# Patient Record
Sex: Female | Born: 1960 | Race: White | Hispanic: No | Marital: Married | Smoking: Former smoker
Health system: Southern US, Community
[De-identification: ages and names within clinical notes are randomized; demographics above are authoritative.]

## PROBLEM LIST (undated history)

## (undated) DIAGNOSIS — I10 Essential (primary) hypertension: Secondary | ICD-10-CM

## (undated) HISTORY — PX: TUBAL LIGATION: SHX77

---

## 1983-05-29 HISTORY — PX: OTHER SURGICAL HISTORY: SHX169

## 1999-04-27 ENCOUNTER — Other Ambulatory Visit: Admission: RE | Admit: 1999-04-27 | Discharge: 1999-04-27 | Payer: Self-pay | Admitting: Obstetrics and Gynecology

## 2000-06-21 ENCOUNTER — Other Ambulatory Visit: Admission: RE | Admit: 2000-06-21 | Discharge: 2000-06-21 | Payer: Self-pay | Admitting: Obstetrics and Gynecology

## 2001-08-13 ENCOUNTER — Other Ambulatory Visit: Admission: RE | Admit: 2001-08-13 | Discharge: 2001-08-13 | Payer: Self-pay | Admitting: Obstetrics and Gynecology

## 2002-10-02 ENCOUNTER — Other Ambulatory Visit: Admission: RE | Admit: 2002-10-02 | Discharge: 2002-10-02 | Payer: Self-pay | Admitting: Obstetrics and Gynecology

## 2004-01-10 ENCOUNTER — Emergency Department (HOSPITAL_COMMUNITY): Admission: EM | Admit: 2004-01-10 | Discharge: 2004-01-10 | Payer: Self-pay | Admitting: Family Medicine

## 2004-02-28 ENCOUNTER — Ambulatory Visit: Payer: Self-pay | Admitting: Internal Medicine

## 2004-03-13 ENCOUNTER — Ambulatory Visit: Payer: Self-pay | Admitting: Internal Medicine

## 2004-03-27 ENCOUNTER — Ambulatory Visit: Payer: Self-pay | Admitting: Internal Medicine

## 2004-04-06 ENCOUNTER — Ambulatory Visit: Payer: Self-pay | Admitting: Internal Medicine

## 2005-08-31 ENCOUNTER — Ambulatory Visit (HOSPITAL_COMMUNITY): Admission: RE | Admit: 2005-08-31 | Discharge: 2005-08-31 | Payer: Self-pay | Admitting: Obstetrics and Gynecology

## 2008-03-30 ENCOUNTER — Ambulatory Visit (HOSPITAL_COMMUNITY): Admission: RE | Admit: 2008-03-30 | Discharge: 2008-03-30 | Payer: Self-pay | Admitting: Obstetrics and Gynecology

## 2011-04-20 ENCOUNTER — Other Ambulatory Visit (HOSPITAL_COMMUNITY): Payer: Self-pay | Admitting: Obstetrics and Gynecology

## 2011-04-20 DIAGNOSIS — Z1231 Encounter for screening mammogram for malignant neoplasm of breast: Secondary | ICD-10-CM

## 2011-05-24 ENCOUNTER — Ambulatory Visit (HOSPITAL_COMMUNITY)
Admission: RE | Admit: 2011-05-24 | Discharge: 2011-05-24 | Disposition: A | Discharge status: Home / Self Care | Payer: Self-pay | Source: Ambulatory Visit | Attending: Obstetrics and Gynecology | Admitting: Obstetrics and Gynecology

## 2011-05-24 DIAGNOSIS — Z1231 Encounter for screening mammogram for malignant neoplasm of breast: Secondary | ICD-10-CM

## 2015-05-19 ENCOUNTER — Encounter (HOSPITAL_COMMUNITY): Payer: Self-pay | Admitting: Emergency Medicine

## 2015-05-19 ENCOUNTER — Emergency Department (HOSPITAL_COMMUNITY)
Admission: EM | Admit: 2015-05-19 | Discharge: 2015-05-19 | Disposition: A | Discharge status: Home / Self Care | Payer: Self-pay | Source: Home / Self Care | Attending: Family Medicine | Admitting: Family Medicine

## 2015-05-19 DIAGNOSIS — J069 Acute upper respiratory infection, unspecified: Secondary | ICD-10-CM

## 2015-05-19 HISTORY — DX: Essential (primary) hypertension: I10

## 2015-05-19 MED ORDER — TRIAMCINOLONE ACETONIDE 55 MCG/ACT NA AERO: 2.0000 | INHALATION_SPRAY | Freq: Every day | NASAL | Status: AC

## 2015-05-19 NOTE — Discharge Instructions (Signed)
It was a pleasure to see you today.   I believe your symptoms are the result of a viral upper respiratory infection.  I recommend the following:  1. Use of nasal saline (over the counter) or a Netty pot to rinse your nasal passages. Do this before using #2:  2. Nasacort nasal steroid spray; 2 sprays in each nostril one time daily.   3. Continue to use Mucinex 600mg  tablets, take 1 tablet by mouth twice daily, to thin mucus and make it easier to come out.   4. Use of throat lozenges for sore throat as needed; teas, honey, vaporizer.  Follow with your primary doctor or the Urgent Care Center if you experience fevers/chills, worsening respiratory symptoms, or other concerns.

## 2015-05-19 NOTE — ED Provider Notes (Signed)
CSN: [id number]     Arrival date & time 05/19/15  1436 History   First MD Initiated Contact with Patient 05/19/15 1607     Chief Complaint  Patient presents with  . URI  . Sinus Problem   (Consider location/radiation/quality/duration/timing/severity/associated sxs/prior Treatment) Patient is a 54 y.o. female presenting with URI and sinus complaint. The history is provided by the patient. No language interpreter was used.  URI Presenting symptoms: congestion, cough and rhinorrhea   Presenting symptoms: no ear pain and no fever   Associated symptoms: no wheezing   Sinus Problem Pertinent negatives include no chest pain and no shortness of breath.  Patient with 2 days of cough, sore throat, started 12/20 and has persisted. Lots of nasal discharge. Taking Mucinex regularly. Ran out of Nasacort nasal spray. Concerned she may develop sinusitis, which she had 2 years ago.  ROS: Denies fevers or chills. Did have nausea/vomiting and diarrhea last night, resolved and is not having today. Ate grilled cheese sandwich and fries today for lunch. Some cough, nasal discharge. No facial pain.   Social Hx; Nonsmoker.   Past Medical History  Diagnosis Date  . Hypertension    Past Surgical History  Procedure Laterality Date  . Tubal ligatioon  1985   No family history on file. Social History  Substance Use Topics  . Smoking status: Never Smoker   . Smokeless tobacco: None  . Alcohol Use: Yes     Comment: ocassionally   OB History    No data available     Review of Systems  Constitutional: Negative for fever, chills and appetite change.  HENT: Positive for congestion, postnasal drip and rhinorrhea. Negative for ear discharge, ear pain, nosebleeds and sinus pressure.   Respiratory: Positive for cough. Negative for chest tightness, shortness of breath and wheezing.   Cardiovascular: Negative for chest pain.  All other systems reviewed and are negative.   Allergies  Review of patient's  allergies indicates no known allergies.  Home Medications   Prior to Admission medications   Medication Sig Start Date End Date Taking? Authorizing Provider  lisinopril (PRINIVIL,ZESTRIL) 10 MG tablet Take 10 mg by mouth daily.   Yes Historical Provider, MD  triamcinolone (NASACORT) 55 MCG/ACT AERO nasal inhaler Place 2 sprays into the nose daily. 05/19/15   Barbaraann Barthel, MD   Meds Ordered and Administered this Visit  Medications - No data to display  BP 151/103 mmHg  Pulse 98  Temp(Src) 98.8 F (37.1 C) (Oral)  Resp 18  SpO2 100% No data found.   Physical Exam  Constitutional: She appears well-developed and well-nourished. No distress.  HENT:  Head: Normocephalic and atraumatic.  Right Ear: External ear normal.  Left Ear: External ear normal.  Mouth/Throat: Oropharynx is clear and moist.  Oropharynx without exudate. Moist mucus membranes.   No frontal or maxillary sinus tenderness.     Eyes: Conjunctivae are normal. Pupils are equal, round, and reactive to light. Right eye exhibits no discharge. Left eye exhibits no discharge.  Neck: Normal range of motion. Neck supple. No thyromegaly present.  Cardiovascular: Normal rate and regular rhythm.   Pulmonary/Chest: Effort normal and breath sounds normal. No stridor. No respiratory distress. She has no wheezes. She has no rales. She exhibits no tenderness.  Abdominal: Soft. She exhibits no distension and no mass. There is no tenderness. There is no rebound and no guarding.  Lymphadenopathy:    She has no cervical adenopathy.  Skin: She is not diaphoretic.  ED Course  Procedures (including critical care time)  Labs Review Labs Reviewed - No data to display  Imaging Review No results found.   Visual Acuity Review  Right Eye Distance:   Left Eye Distance:   Bilateral Distance:    Right Eye Near:   Left Eye Near:    Bilateral Near:         MDM   1. Viral upper respiratory infection    Patient with  symptoms of viral upper respiratory infection, supportive care and follow up indications.     Barbaraann BarthelJames O Torah Pinnock, MD 05/19/15 [phone]

## 2015-05-19 NOTE — ED Notes (Signed)
Here with URI sx's head cold with congestions, slight cough and sinus pressure Started 2 days ago with worsening diarrhea and vomiting yesterday Denies chest pain, sob or fever Taking Mucinex with some relief BP elevated, didn't take meds today

## 2018-03-10 ENCOUNTER — Other Ambulatory Visit: Payer: Self-pay

## 2018-03-17 LAB — CYTOLOGY - PAP: DIAGNOSIS: NEGATIVE

## 2018-03-24 ENCOUNTER — Other Ambulatory Visit (HOSPITAL_COMMUNITY): Payer: Self-pay | Admitting: *Deleted

## 2018-03-24 DIAGNOSIS — Z1231 Encounter for screening mammogram for malignant neoplasm of breast: Secondary | ICD-10-CM

## 2018-06-19 ENCOUNTER — Encounter (HOSPITAL_COMMUNITY): Payer: Self-pay | Admitting: *Deleted

## 2018-06-19 ENCOUNTER — Encounter (HOSPITAL_COMMUNITY): Payer: Self-pay

## 2018-06-19 ENCOUNTER — Ambulatory Visit
Admission: RE | Admit: 2018-06-19 | Discharge: 2018-06-19 | Disposition: A | Discharge status: Home / Self Care | Payer: No Typology Code available for payment source | Source: Ambulatory Visit | Attending: Obstetrics and Gynecology | Admitting: Obstetrics and Gynecology

## 2018-06-19 ENCOUNTER — Ambulatory Visit (HOSPITAL_COMMUNITY)
Admission: RE | Admit: 2018-06-19 | Discharge: 2018-06-19 | Disposition: A | Discharge status: Home / Self Care | Payer: Self-pay | Source: Ambulatory Visit | Attending: Obstetrics and Gynecology | Admitting: Obstetrics and Gynecology

## 2018-06-19 ENCOUNTER — Encounter (INDEPENDENT_AMBULATORY_CARE_PROVIDER_SITE_OTHER): Payer: Self-pay

## 2018-06-19 VITALS — BP 98/64 | Ht 69.0 in | Wt 128.0 lb

## 2018-06-19 DIAGNOSIS — Z1231 Encounter for screening mammogram for malignant neoplasm of breast: Secondary | ICD-10-CM

## 2018-06-19 DIAGNOSIS — Z1239 Encounter for other screening for malignant neoplasm of breast: Secondary | ICD-10-CM

## 2018-06-19 NOTE — Progress Notes (Signed)
No complaints today.   Pap Smear: Pap smear not completed today. Last Pap smear was 03/10/2018 at the free cervical cancer screening at the [facility] and normal. Per patient has no history of an abnormal Pap smear. Last Pap smear result is in Epic.  Physical exam: Breasts Breasts symmetrical. No skin abnormalities bilateral breasts. No nipple retraction bilateral breasts. No nipple discharge bilateral breasts. No lymphadenopathy. No lumps palpated bilateral breasts. No complaints of pain or tenderness on exam. Referred patient to the [facility] for a screening mammogram. Appointment scheduled for Thursday, June 19, 2018 at 1110.       Pelvic/Bimanual No Pap smear completed today since last Pap smear was 03/10/2018. Pap smear not indicated per [facility] guidelines.   Smoking History: Patient has never smoked.  Patient Navigation: Patient education provided. Access to services provided for patient through [facility] program.   Colorectal Cancer Screening: Per patient has never had a colonoscopy completed. No complaints today. FIT Test given to patient to complete and return to [facility].  Breast and Cervical Cancer Risk Assessment: Patient has no family history of breast cancer, known genetic mutations, or radiation treatment to the chest before age 79. Patient has no history of cervical dysplasia, immunocompromised, or DES exposure in-utero.  Risk Assessment    Risk Scores      06/19/2018   Last edited by: Lynnell Dike, LPN   5-year risk: 1 %   Lifetime risk: 5.6 %

## 2018-06-19 NOTE — Patient Instructions (Signed)
Explained breast self awareness with Dawn Blackwell. Patient did not need a Pap smear today due to last Pap smear was 03/10/2018. Let her know [facility] will cover Pap smears every 3 years unless has a history of abnormal Pap smears. Referred patient to the [facility] for a screening mammogram. Appointment scheduled for Thursday, June 19, 2018 at 1110. Patient aware of appointment and will be there. Let patient know the Breast Center will follow up with her within the next couple weeks with results of mammogram by letter or phone. [name] verbalized understanding.  Renika Shiflet, Kathaleen Maser, RN 10:56 AM

## 2018-06-23 ENCOUNTER — Other Ambulatory Visit: Payer: Self-pay

## 2018-07-06 LAB — FECAL OCCULT BLOOD, IMMUNOCHEMICAL: Fecal Occult Bld: NEGATIVE

## 2018-07-07 ENCOUNTER — Encounter (HOSPITAL_COMMUNITY): Payer: Self-pay

## 2019-03-12 ENCOUNTER — Other Ambulatory Visit (HOSPITAL_COMMUNITY): Payer: Self-pay | Admitting: *Deleted

## 2019-03-12 DIAGNOSIS — Z1231 Encounter for screening mammogram for malignant neoplasm of breast: Secondary | ICD-10-CM

## 2019-06-23 ENCOUNTER — Other Ambulatory Visit: Payer: Self-pay

## 2019-06-23 ENCOUNTER — Ambulatory Visit
Admission: RE | Admit: 2019-06-23 | Discharge: 2019-06-23 | Disposition: A | Discharge status: Home / Self Care | Payer: Self-pay | Source: Ambulatory Visit | Attending: Obstetrics and Gynecology | Admitting: Obstetrics and Gynecology

## 2019-06-23 ENCOUNTER — Other Ambulatory Visit: Payer: Self-pay | Admitting: Family Medicine

## 2019-06-23 ENCOUNTER — Ambulatory Visit (HOSPITAL_COMMUNITY): Payer: Self-pay

## 2019-06-23 DIAGNOSIS — Z1231 Encounter for screening mammogram for malignant neoplasm of breast: Secondary | ICD-10-CM

## 2019-12-02 ENCOUNTER — Ambulatory Visit: Payer: No Typology Code available for payment source

## 2019-12-03 ENCOUNTER — Other Ambulatory Visit: Payer: Self-pay

## 2019-12-03 DIAGNOSIS — Z0000 Encounter for general adult medical examination without abnormal findings: Secondary | ICD-10-CM

## 2019-12-14 ENCOUNTER — Inpatient Hospital Stay: Payer: Self-pay | Attending: Obstetrics and Gynecology | Admitting: *Deleted

## 2019-12-14 ENCOUNTER — Other Ambulatory Visit: Payer: Self-pay

## 2019-12-14 VITALS — BP 116/78 | Ht 68.0 in | Wt 129.2 lb

## 2019-12-14 DIAGNOSIS — Z0000 Encounter for general adult medical examination without abnormal findings: Secondary | ICD-10-CM

## 2019-12-14 NOTE — Progress Notes (Signed)
[facility] initial screening     Clinical Measurement:  Vitals:   12/14/19 0825 12/14/19 0831  BP: 115/76 116/78   Fasting Labs Drawn Today, will review with patient when they result.   Medical History:  Patient states that she does not have high cholesterol, has high blood pressure and she does not have diabetes.  Medications:  Patient states that she does take medication to lower blood pressure.  Patient does not take an aspirin a day to help prevent a heart attack or stroke. During the past 7 days patient has taken prescribed medication to lower blood pressure on 7 days.   Blood pressure, self measurement:  :  Patient states that she does not measure blood pressure from home. She checks her blood pressure N/A. She shares her readings with a health care provider: N/A.   Nutrition: Patient states that on average she eats 3 cups of fruit and 3 cups of vegetables per day. Patient states that she does eat fish at least 2 times per week. Patient eats more than half servings of whole grains. Patient drinks less than 36 ounces of beverages with added sugar weekly: yes. Patient is currently watching sodium or salt intake: yes. In the past 7 days patient has had 1 day where she has had drinks containing alcohol. On average patient drinks 3 drinks containing alcohol per day.      Physical activity:  Patient states that she gets 75 minutes of moderate and 75 minutes of vigorous physical activity each week.  Smoking status:  Patient states that she has is a former smoker, quit 12+ months ago .   Quality of life:  Over the past 2 weeks patient states that she had little interest or pleasure in doing things: not at all. She has been feeling down, depressed or hopeless:not at all.    Risk reduction and counseling:   Discussed the importance of fruits and vegetables in daily diet. Also spoke with the patient about heart healthy fish and how they can help lower cholesterol and protect the heart against  heart disease. Gave examples of salmon, tuna, mackerel and sardines (fresh, frozen or canned). We also spoke about continuing to monitor the amount of salt consumed given patient's past history of hypertension. Encouraged patient to continue with daily exercise.    Navigation:  I will notify patient of lab results.  Patient is aware of 2 more health coaching sessions and a follow up.  Time: 20 minutes

## 2019-12-15 LAB — LIPID PANEL
Chol/HDL Ratio: 2.1 ratio (ref 0.0–4.4)
Cholesterol, Total: 154 mg/dL (ref 100–199)
HDL: 73 mg/dL (ref >39)
LDL Chol Calc (NIH): 66 mg/dL (ref 0–99)
Triglycerides: 79 mg/dL (ref 0–149)
VLDL Cholesterol Cal: 15 mg/dL (ref 5–40)

## 2019-12-15 LAB — GLUCOSE, RANDOM: Glucose: 93 mg/dL (ref 65–99)

## 2019-12-15 LAB — HEMOGLOBIN A1C
Est. average glucose Bld gHb Est-mCnc: 108 mg/dL
Hgb A1c MFr Bld: 5.4 % (ref 4.8–5.6)

## 2019-12-17 ENCOUNTER — Telehealth: Payer: Self-pay

## 2019-12-17 NOTE — Telephone Encounter (Signed)
Returned patient's phone call. Left name and number for patient to call back. °

## 2019-12-17 NOTE — Telephone Encounter (Signed)
Health coaching 2     Labs-154 cholesterol , 66 LDL cholesterol , 79 triglycerides , 73 HDL cholesterol , 5.4hemoglobin A1C , 93 mean plasma glucose   Patient understands and is aware of her lab results.   Goals-  Discussed lab results with patient. Answered any questions that patient had regarding results. All labs were within the normal range. Patient was concerned that glucose was in the upper end of normal. Discussed with patient what glucose is and what foods can cause elevated blood sugar. Patient stated that since she has been working out more she has been consuming more carbs. Encouraged patient to try cutting back on her carb intake and adding in more lean proteins instead. Gave suggestions for grilled chicken, fish and lean cuts of pork. Patient voiced understanding and stated that she will work on this over the coming weeks.   Navigation:  Patient is aware of 1 more health coaching sessions and a follow up.   Time- 10 minutes

## 2020-01-12 ENCOUNTER — Telehealth: Payer: Self-pay

## 2020-01-12 NOTE — Telephone Encounter (Addendum)
Returned patient's phone call. Discussed the cologuard test vs the FIT Test. Explained to patient that both tests screen for blood in the stool. The last at home test that the patient completed was in January 2020 and that it is time to complete another test. Will give new FIT Test to patient when she comes for FU visit for [facility].  Health Coaching 3    Goals- Patient has been working on continuing with a healthy diet and exercise.    New goal- Today we talked more about carbs and proteins in diet. Explained to patient that the recommendation is for 6 oz a day of grains or less. We talked about what foods equal 1 ounce. We also talked about protein intake. Explained that the recommendation is for 5.5 ounces a day of protein. Encouraged patient to continue with her exercise and the changes she has made with diet.   Barrier to reaching goal- None   Strategies to overcome- NA   Navigation:  Patient is aware of  a follow up session. Patient is scheduled for follow-up appointment on September 20th @ 8:00 am.   Time- 10 minutes

## 2020-02-15 ENCOUNTER — Other Ambulatory Visit: Payer: Self-pay

## 2020-02-15 ENCOUNTER — Inpatient Hospital Stay: Payer: No Typology Code available for payment source | Attending: Obstetrics and Gynecology | Admitting: *Deleted

## 2020-02-15 VITALS — BP 108/70 | Ht 68.0 in | Wt 125.8 lb

## 2020-02-15 DIAGNOSIS — Z0000 Encounter for general adult medical examination without abnormal findings: Secondary | ICD-10-CM

## 2020-02-15 DIAGNOSIS — Z1211 Encounter for screening for malignant neoplasm of colon: Secondary | ICD-10-CM

## 2020-02-15 NOTE — Progress Notes (Addendum)
[facility]woman follow up   Clinical Measurement:   Vitals:   02/15/20 0825 02/15/20 1044  BP: 108/76 108/70      Medical History:  Patient states that she does not have high cholesterol, has high blood pressure and she does not have diabetes.  Medications:  Patient states that she doestake medication to lower blood pressure. Patient does not take medication to lower cholesterol or blood sugar. Patient does not take an aspirin a day to help prevent a heart attack or stroke. During the past 7 days patient has taken prescribed medication to lower blood pressure on 7 days.   Blood pressure, self measurement: Patient states that she does not measure blood pressure from home. She checks her blood pressure N/A. She shares her readings with a health care provider: N/A.   Nutrition: Patient states that on average she eats 3 cups of fruit and 3 cups of vegetables per day. Patient states that she does eat fish at least 2 times per week. Patient eats more than half servings of whole grains. Patient drinks less than 36 ounces of beverages with added sugar weekly: yes. Patient is currently watching sodium or salt intake: yes. In the past 7 days patient has had 0 drinks containing alcohol. On average patient drinks 3 drinks containing alcohol per day.      Physical activity:  Patient states that she gets 75 minutes of moderate and 75 minutes of vigorous physical activity each week.  Smoking status:  Patient states that she has is a former smoker, quit 12+ months ago .   Quality of life:  Over the past 2 weeks patient states that she had little interest or pleasure in doing things: not at all. She has been feeling down, depressed or hopeless:not at all.    Risk reduction and counseling:   Health Coaching:   Spoke with patient today about daily recommendation for the grain (carb) group. Patient works out often and had met with a nutritionist from her gym in the past.  Explained to patient that the daily  recommendation is for 6 ounces of grains per day. Explained to patient what 1 ounce would like: 1 slice of bread 1/2 of bagel or english muffin 1/2 cup of dry cereal 1/2 cup of cooked pasta, rice, oatmeal     Navigation: This was the  follow up session for this patient, I will check up on her progress in the coming months.  Time: 20 minutes

## 2020-02-16 NOTE — Addendum Note (Signed)
Addended by: Meryl Dare on: 02/16/2020 03:13 PM   Modules accepted: Orders

## 2020-02-16 NOTE — Addendum Note (Signed)
Addended by: Meryl Dare on: 02/16/2020 03:34 PM   Modules accepted: Orders

## 2020-02-16 NOTE — Addendum Note (Signed)
Addended by: Meryl Dare on: 02/16/2020 03:16 PM   Modules accepted: Orders

## 2020-02-28 ENCOUNTER — Other Ambulatory Visit: Payer: Self-pay | Admitting: Obstetrics and Gynecology

## 2020-03-17 LAB — FECAL OCCULT BLOOD, IMMUNOCHEMICAL

## 2020-08-04 ENCOUNTER — Other Ambulatory Visit: Payer: Self-pay | Admitting: Family Medicine

## 2020-08-04 DIAGNOSIS — Z1231 Encounter for screening mammogram for malignant neoplasm of breast: Secondary | ICD-10-CM

## 2020-12-28 ENCOUNTER — Ambulatory Visit
Admission: RE | Admit: 2020-12-28 | Discharge: 2020-12-28 | Disposition: A | Discharge status: Home / Self Care | Payer: No Typology Code available for payment source | Source: Ambulatory Visit | Attending: Family Medicine | Admitting: Family Medicine

## 2020-12-28 ENCOUNTER — Other Ambulatory Visit: Payer: Self-pay

## 2020-12-28 DIAGNOSIS — Z1231 Encounter for screening mammogram for malignant neoplasm of breast: Secondary | ICD-10-CM

## 2021-12-19 IMAGING — MG MM DIGITAL SCREENING BILAT W/ TOMO AND CAD
8 series · 9 of 24 positions shown · non-contrast
Comparison: Previous exam(s).

CLINICAL DATA: Screening.

EXAM:
DIGITAL SCREENING BILATERAL MAMMOGRAM WITH TOMOSYNTHESIS AND CAD
TECHNIQUE: Bilateral screening digital craniocaudal and mediolateral oblique
mammograms were obtained. Bilateral screening digital breast
tomosynthesis was performed. The images were evaluated with
computer-aided detection.

[R CC synth-2D]
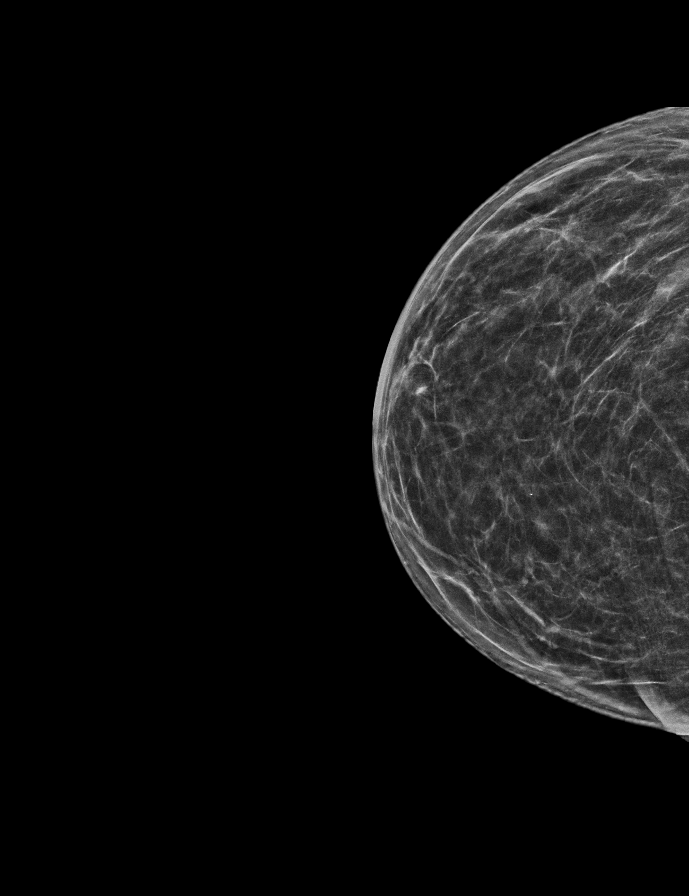

[L MLO synth-2D]
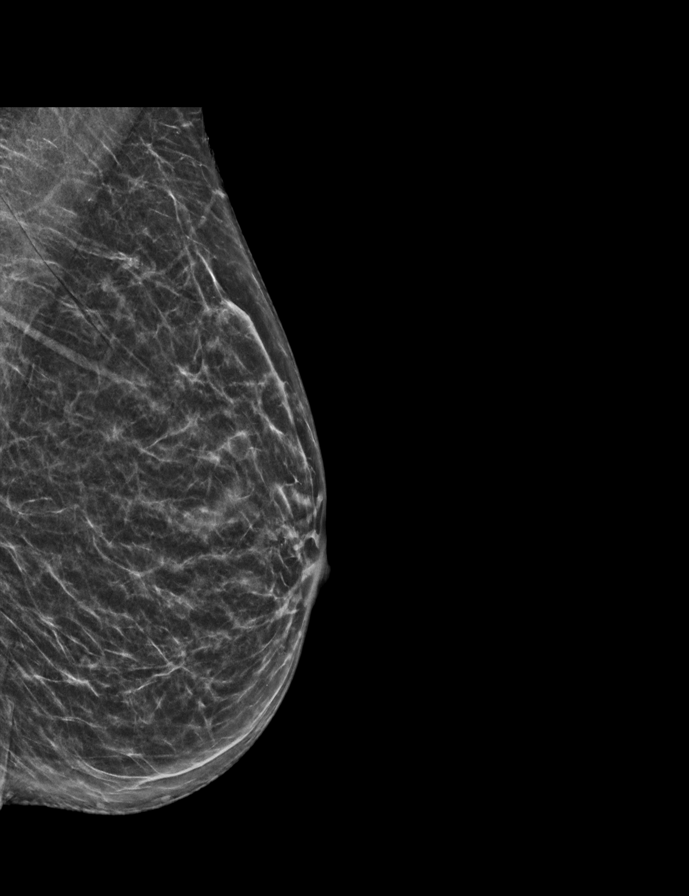

[R MLO synth-2D]
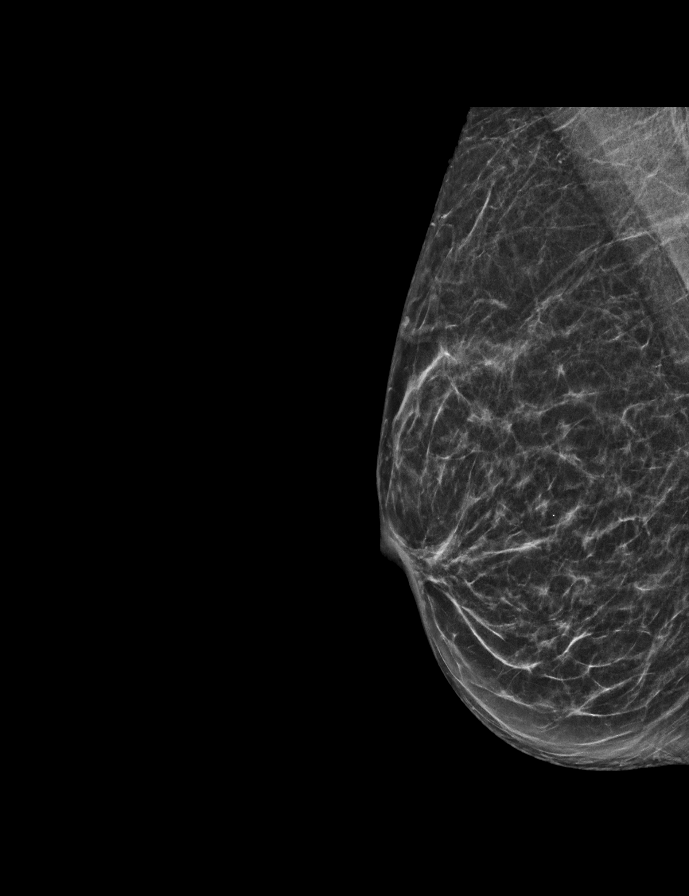

[L CC synth-2D]
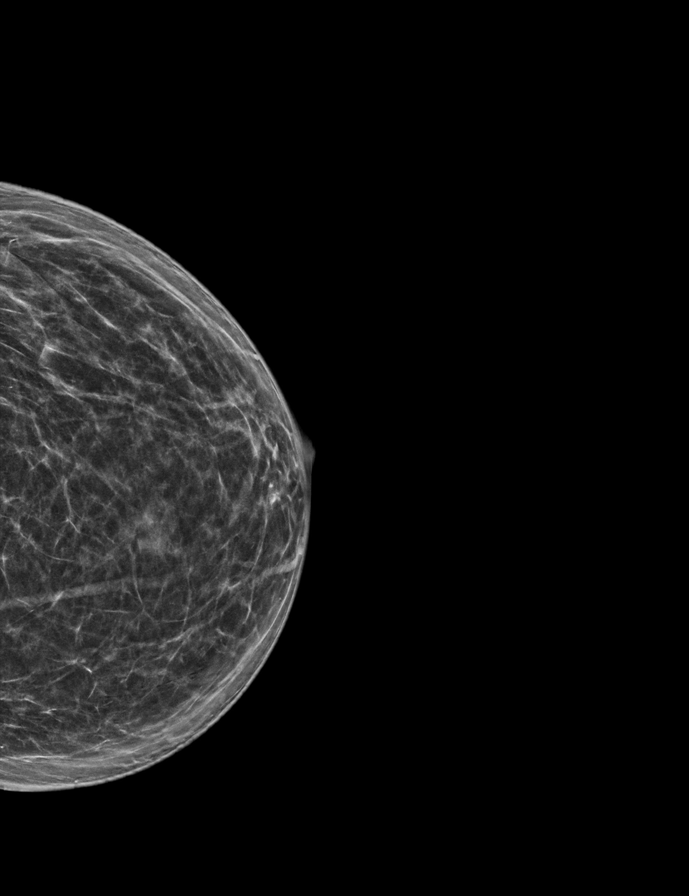

[L MLO tomo · 2 of 54 frames shown]
[frame 18/54]
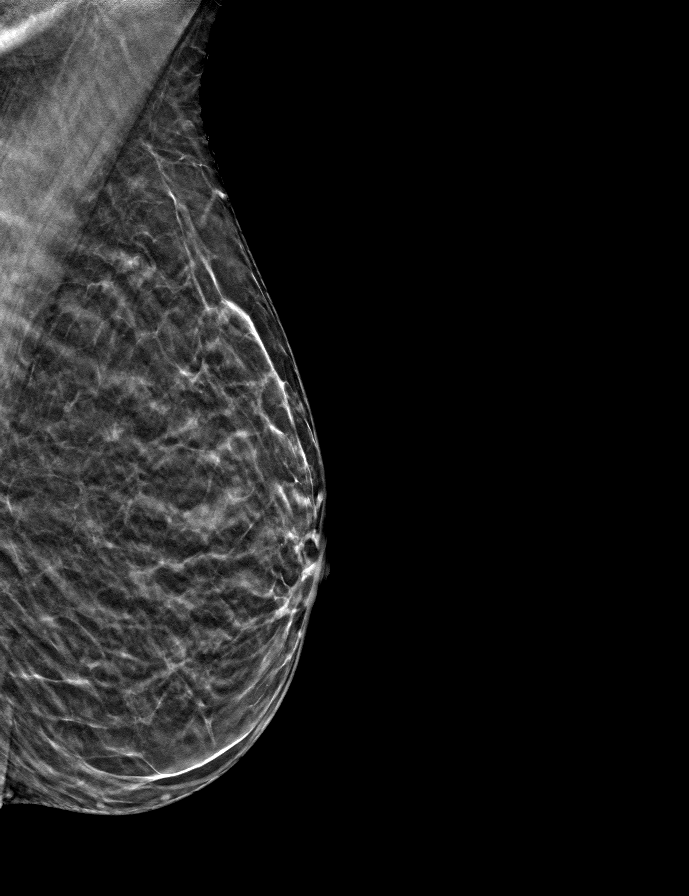
[frame 27/54]
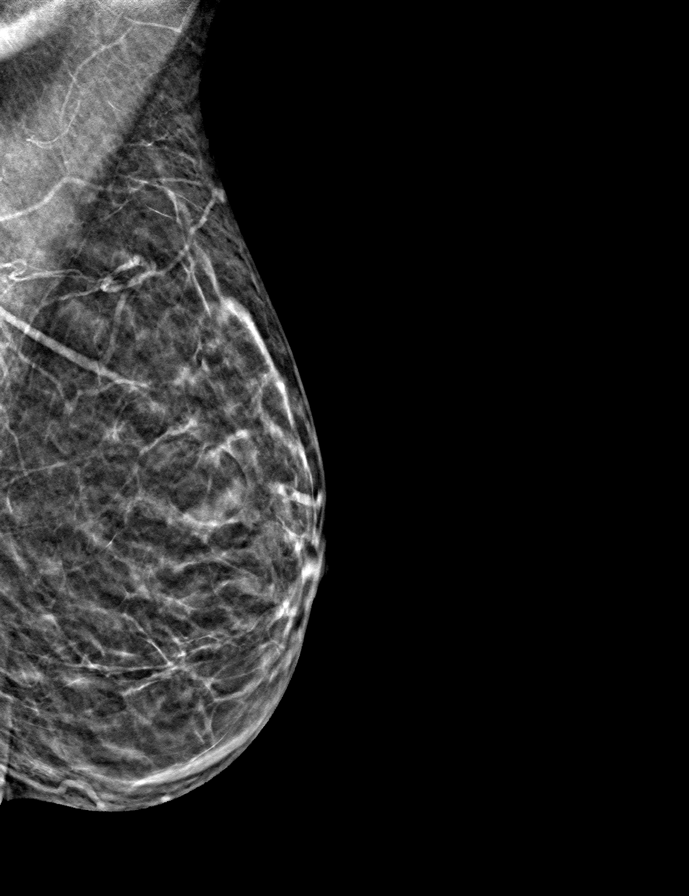

[L CC tomo · tomo slice 31/60.0]
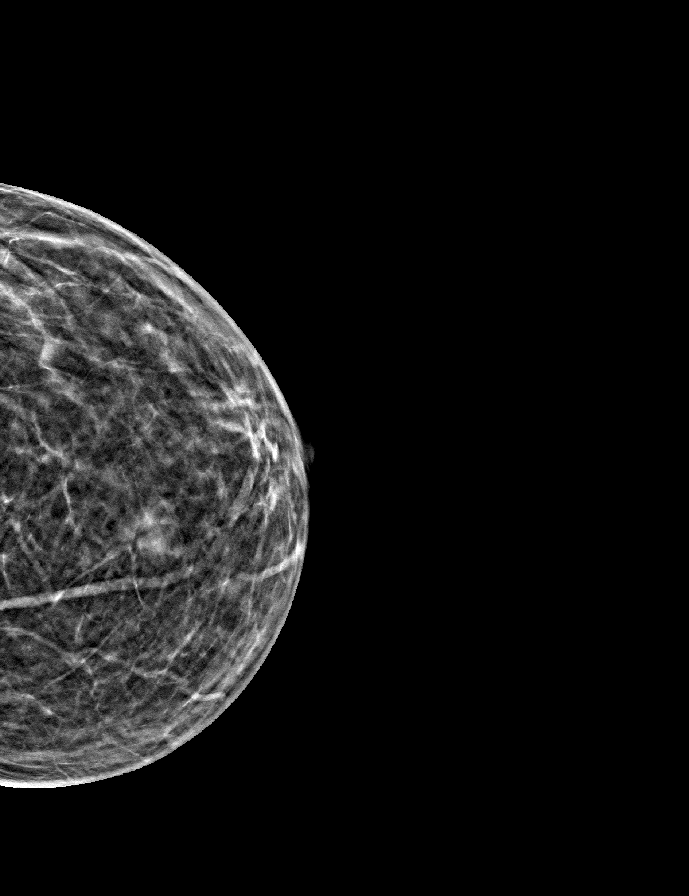

[R CC tomo · tomo slice 27/53.0]
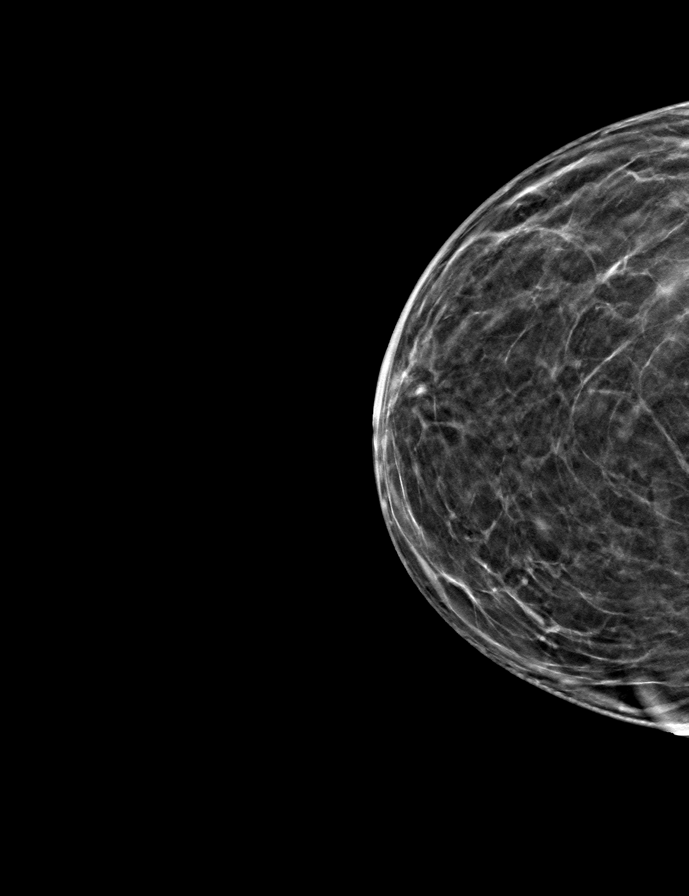

[R MLO tomo · tomo slice 27/52.0]
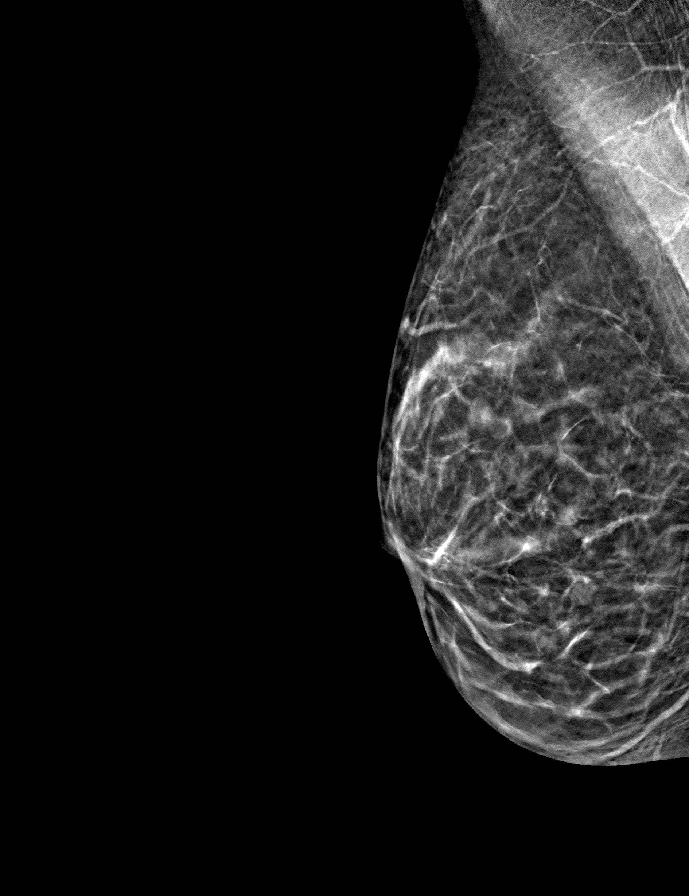

[9 of 24 positions shown; findings below may reference images not displayed]

ACR Breast Density Category b: There are scattered areas of
fibroglandular density.
FINDINGS: There are no findings suspicious for malignancy.
IMPRESSION: No mammographic evidence of malignancy. A result letter of this
screening mammogram will be mailed directly to the patient.

RECOMMENDATION:
Screening mammogram in one year. (Code:51-O-LD2)

BI-RADS CATEGORY  1: Negative.

## 2022-01-30 ENCOUNTER — Other Ambulatory Visit: Payer: Self-pay | Admitting: Family Medicine

## 2022-01-30 DIAGNOSIS — Z1231 Encounter for screening mammogram for malignant neoplasm of breast: Secondary | ICD-10-CM

## 2022-02-08 ENCOUNTER — Ambulatory Visit
Admission: RE | Admit: 2022-02-08 | Discharge: 2022-02-08 | Disposition: A | Discharge status: Home / Self Care | Payer: 59 | Source: Ambulatory Visit | Attending: Family Medicine | Admitting: Family Medicine

## 2022-02-08 DIAGNOSIS — Z1231 Encounter for screening mammogram for malignant neoplasm of breast: Secondary | ICD-10-CM
